# Patient Record
Sex: Male | Born: 1995 | Race: White | Hispanic: No | Marital: Single | Smoking: Never smoker
Health system: Southern US, Community
[De-identification: ages and names within clinical notes are randomized; demographics above are authoritative.]

---
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
## 2021-01-08 ENCOUNTER — Ambulatory Visit
Admission: EM | Admit: 2021-01-08 | Discharge: 2021-01-08 | Disposition: A | Discharge status: Home / Self Care | Payer: 59 | Attending: Urgent Care | Admitting: Urgent Care

## 2021-01-08 ENCOUNTER — Other Ambulatory Visit: Payer: Self-pay

## 2021-01-08 DIAGNOSIS — J069 Acute upper respiratory infection, unspecified: Secondary | ICD-10-CM

## 2021-01-08 DIAGNOSIS — R519 Headache, unspecified: Secondary | ICD-10-CM

## 2021-01-08 DIAGNOSIS — J3489 Other specified disorders of nose and nasal sinuses: Secondary | ICD-10-CM

## 2021-01-08 DIAGNOSIS — R52 Pain, unspecified: Secondary | ICD-10-CM

## 2021-01-08 MED ORDER — BENZONATATE 100 MG PO CAPS: 100.0000 mg | ORAL_CAPSULE | Freq: Three times a day (TID) | ORAL | 0 refills | Status: AC | PRN

## 2021-01-08 MED ORDER — PSEUDOEPHEDRINE HCL 60 MG PO TABS: 60.0000 mg | ORAL_TABLET | Freq: Three times a day (TID) | ORAL | 0 refills | Status: AC | PRN

## 2021-01-08 MED ORDER — PROMETHAZINE-DM 6.25-15 MG/5ML PO SYRP: 5.0000 mL | ORAL_SOLUTION | Freq: Every evening | ORAL | 0 refills | Status: AC | PRN

## 2021-01-08 MED ORDER — CETIRIZINE HCL 10 MG PO TABS: 10.0000 mg | ORAL_TABLET | Freq: Every day | ORAL | 0 refills | Status: AC

## 2021-01-08 NOTE — ED Triage Notes (Signed)
Onset yesterday of HA, rhinorrhea, cough and body aches. Pt reports that he woke up this morning feeling a little dizzy. Dizziness resolved with rest and mucinex. Has been taking mucinex with temporary relief. Pt is not covid vaccinated.

## 2021-01-08 NOTE — Discharge Instructions (Signed)

## 2021-01-08 NOTE — ED Provider Notes (Signed)
[facility]   MRN: [id number] DOB: [birth year]  Subjective:   Jared Evans is a 25 y.o. male presenting for 1 day history of sinus headaches, runny and stuffy nose, coughing, body aches, malaise and fatigue.  He had some slight dizziness today.  Has gotten better with some rest and Mucinex.  No chest pain, shortness of breath or wheezing.  Patient is non-smoker.  No history of asthma.  Wants to be checked for COVID and flu.  No current facility-administered medications for this encounter.  Current Outpatient Medications:    acetaminophen (TYLENOL) 500 MG tablet, Take 1,000 mg by mouth every 6 (six) hours as needed for mild pain. , Disp: , Rfl:    ondansetron (ZOFRAN) 8 MG tablet, Take 1 tablet (8 mg total) by mouth every 8 (eight) hours as needed for nausea., Disp: 12 tablet, Rfl: 0   No Known Allergies  History reviewed. No pertinent past medical history.   History reviewed. No pertinent surgical history.  History reviewed. No pertinent family history.  Social History   Tobacco Use   Smoking status: Never   Smokeless tobacco: Never  Substance Use Topics   Alcohol use: No   Drug use: No    ROS   Objective:   Vitals: BP 114/74 (BP Location: Right Arm)   Pulse 88   Temp 98.4 F (36.9 C) (Oral)   Resp 18   SpO2 97%   Physical Exam Constitutional:      General: He is not in acute distress.    Appearance: Normal appearance. He is well-developed and normal weight. He is not ill-appearing, toxic-appearing or diaphoretic.  HENT:     Head: Normocephalic and atraumatic.     Right Ear: Tympanic membrane, ear canal and external ear normal. There is no impacted cerumen.     Left Ear: Tympanic membrane, ear canal and external ear normal. There is no impacted cerumen.     Nose: Congestion present. No rhinorrhea.     Mouth/Throat:     Mouth: Mucous membranes are moist.     Pharynx: No oropharyngeal exudate or posterior oropharyngeal erythema.  Eyes:      General: No scleral icterus.       Right eye: No discharge.        Left eye: No discharge.     Extraocular Movements: Extraocular movements intact.     Conjunctiva/sclera: Conjunctivae normal.     Pupils: Pupils are equal, round, and reactive to light.  Cardiovascular:     Rate and Rhythm: Normal rate and regular rhythm.     Heart sounds: Normal heart sounds. No murmur heard.   No friction rub. No gallop.  Pulmonary:     Effort: Pulmonary effort is normal. No respiratory distress.     Breath sounds: Normal breath sounds. No stridor. No wheezing, rhonchi or rales.  Musculoskeletal:     Cervical back: Normal range of motion and neck supple. No rigidity. No muscular tenderness.  Skin:    General: Skin is warm and dry.  Neurological:     General: No focal deficit present.     Mental Status: He is alert and oriented to person, place, and time.  Psychiatric:        Mood and Affect: Mood normal.        Behavior: Behavior normal.        Thought Content: Thought content normal.     Assessment and Plan :   PDMP not reviewed this encounter.  1. Viral URI  with cough   2. Stuffy and runny nose   3. Sinus headache   4. Body aches    COVID and flu test pending.  We will otherwise manage for viral upper respiratory infection.  Physical exam findings reassuring and vital signs stable for discharge. Advised supportive care, offered symptomatic relief. Deferred imaging given clear cardiopulmonary exam, hemodynamically stable vital signs. Counseled patient on potential for adverse effects with medications prescribed/recommended today, ER and return-to-clinic precautions discussed, patient verbalized understanding.      Wallis Bamberg, PA-C 01/08/21 1147

## 2021-01-09 LAB — COVID-19, FLU A+B NAA
Influenza A, NAA: NOT DETECTED
Influenza B, NAA: NOT DETECTED
SARS-CoV-2, NAA: DETECTED — AB
